# Patient Record
Sex: Male | Born: 1980 | Race: White | Hispanic: Yes | Marital: Single | Smoking: Former smoker
Health system: Southern US, Community
[De-identification: ages and names within clinical notes are randomized; demographics above are authoritative.]

## PROBLEM LIST (undated history)

## (undated) DIAGNOSIS — S060XAA Concussion with loss of consciousness status unknown, initial encounter: Secondary | ICD-10-CM

## (undated) DIAGNOSIS — S060X9A Concussion with loss of consciousness of unspecified duration, initial encounter: Secondary | ICD-10-CM

---

## 2013-03-22 ENCOUNTER — Emergency Department (HOSPITAL_BASED_OUTPATIENT_CLINIC_OR_DEPARTMENT_OTHER)
Admission: EM | Admit: 2013-03-22 | Discharge: 2013-03-22 | Disposition: A | Discharge status: Home / Self Care | Payer: Self-pay | Attending: Emergency Medicine | Admitting: Emergency Medicine

## 2013-03-22 ENCOUNTER — Encounter (HOSPITAL_BASED_OUTPATIENT_CLINIC_OR_DEPARTMENT_OTHER): Payer: Self-pay | Admitting: *Deleted

## 2013-03-22 DIAGNOSIS — Z87828 Personal history of other (healed) physical injury and trauma: Secondary | ICD-10-CM | POA: Insufficient documentation

## 2013-03-22 DIAGNOSIS — K029 Dental caries, unspecified: Secondary | ICD-10-CM | POA: Insufficient documentation

## 2013-03-22 DIAGNOSIS — F17200 Nicotine dependence, unspecified, uncomplicated: Secondary | ICD-10-CM | POA: Insufficient documentation

## 2013-03-22 HISTORY — DX: Concussion with loss of consciousness of unspecified duration, initial encounter: S06.0X9A

## 2013-03-22 HISTORY — DX: Concussion with loss of consciousness status unknown, initial encounter: S06.0XAA

## 2013-03-22 MED ORDER — HYDROCODONE-ACETAMINOPHEN 5-325 MG PO TABS: 1.0000 | ORAL_TABLET | Freq: Four times a day (QID) | ORAL | Status: DC | PRN

## 2013-03-22 MED ORDER — PENICILLIN V POTASSIUM 500 MG PO TABS: 500.0000 mg | ORAL_TABLET | Freq: Four times a day (QID) | ORAL | Status: AC

## 2013-03-22 NOTE — ED Notes (Signed)
Pt sts that he is allergic to Lortab but he can take Vicodin.

## 2013-03-22 NOTE — ED Notes (Signed)
Pt c/o pain in his upper left molar x3 days.

## 2013-03-22 NOTE — ED Provider Notes (Signed)
   History    CSN: [id number] Arrival date & time 03/22/13  0541  First MD Initiated Contact with Patient 03/22/13 0543     Chief Complaint  Patient presents with  . Dental Pain   (Consider location/radiation/quality/duration/timing/severity/associated sxs/prior Treatment) Patient is a 32 y.o. male presenting with tooth pain. The history is provided by the patient. No language interpreter was used.  Dental Pain Location:  Upper Upper teeth location:  10/LU lateral incisor Quality:  Dull Severity:  Severe Onset quality:  Gradual Duration:  3 days Timing:  Constant Progression:  Unchanged Chronicity:  New Context: dental fracture   Context: not abscess   Previous work-up:  Dental exam Relieved by:  Nothing Worsened by:  Nothing tried Ineffective treatments:  None tried Associated symptoms: no fever and no neck swelling   Risk factors: smoking    Past Medical History  Diagnosis Date  . Concussion    History reviewed. No pertinent past surgical history. No family history on file. History  Substance Use Topics  . Smoking status: Current Every Day Smoker  . Smokeless tobacco: Not on file  . Alcohol Use: No    Review of Systems  Constitutional: Negative for fever.  All other systems reviewed and are negative.    Allergies  Flexeril; Lortab; and Ultram  Home Medications  No current outpatient prescriptions on file. BP 142/91  Pulse 86  Temp(Src) 98.1 F (36.7 C) (Oral)  Resp 18  Ht 6' (1.829 m)  Wt 175 lb (79.379 kg)  BMI 23.73 kg/m2  SpO2 100% Physical Exam  Constitutional: He is oriented to person, place, and time. He appears well-developed and well-nourished. No distress.  HENT:  Head: Normocephalic and atraumatic.  Mouth/Throat: Oropharynx is clear and moist.    Eyes: Conjunctivae are normal. Pupils are equal, round, and reactive to light.  Neck: Normal range of motion. Neck supple.  Cardiovascular: Normal rate and regular rhythm.     Pulmonary/Chest: Effort normal and breath sounds normal. He has no wheezes. He has no rales.  Abdominal: Soft. Bowel sounds are normal.  Musculoskeletal: Normal range of motion.  Lymphadenopathy:    He has no cervical adenopathy.  Neurological: He is alert and oriented to person, place, and time.  Skin: Skin is warm and dry.  Psychiatric: He has a normal mood and affect.    ED Course  Procedures (including critical care time) Labs Reviewed - No data to display No results found. 1. Dental caries     MDM  Patient has an appointment per his report on Monday with a dentist and states with nurse present that he can take vicodin without issue but not lortab  [name] K Deeya Richeson-Rasch, MD 03/22/13 [phone]

## 2013-08-13 ENCOUNTER — Encounter (HOSPITAL_BASED_OUTPATIENT_CLINIC_OR_DEPARTMENT_OTHER): Payer: Self-pay | Admitting: Emergency Medicine

## 2013-08-13 ENCOUNTER — Emergency Department (HOSPITAL_BASED_OUTPATIENT_CLINIC_OR_DEPARTMENT_OTHER)
Admission: EM | Admit: 2013-08-13 | Discharge: 2013-08-13 | Disposition: A | Discharge status: Home / Self Care | Payer: Self-pay | Attending: Emergency Medicine | Admitting: Emergency Medicine

## 2013-08-13 DIAGNOSIS — Z87891 Personal history of nicotine dependence: Secondary | ICD-10-CM | POA: Insufficient documentation

## 2013-08-13 DIAGNOSIS — R51 Headache: Secondary | ICD-10-CM | POA: Insufficient documentation

## 2013-08-13 DIAGNOSIS — Z87828 Personal history of other (healed) physical injury and trauma: Secondary | ICD-10-CM | POA: Insufficient documentation

## 2013-08-13 DIAGNOSIS — H53149 Visual discomfort, unspecified: Secondary | ICD-10-CM | POA: Insufficient documentation

## 2013-08-13 DIAGNOSIS — R112 Nausea with vomiting, unspecified: Secondary | ICD-10-CM | POA: Insufficient documentation

## 2013-08-13 DIAGNOSIS — Z8659 Personal history of other mental and behavioral disorders: Secondary | ICD-10-CM | POA: Insufficient documentation

## 2013-08-13 MED ORDER — METOCLOPRAMIDE HCL 5 MG/ML IJ SOLN
10.0000 mg | Freq: Once | INTRAMUSCULAR | Status: AC
  Administered 2013-08-13: 10 mg via INTRAVENOUS
  Filled 2013-08-13: qty 2

## 2013-08-13 MED ORDER — PROMETHAZINE HCL 25 MG PO TABS: 25.0000 mg | ORAL_TABLET | Freq: Four times a day (QID) | ORAL | Status: DC | PRN

## 2013-08-13 MED ORDER — SODIUM CHLORIDE 0.9 % IV SOLN
Freq: Once | INTRAVENOUS | Status: AC
  Administered 2013-08-13: 18:00:00 via INTRAVENOUS

## 2013-08-13 NOTE — ED Notes (Signed)
NP at bedside.

## 2013-08-13 NOTE — ED Provider Notes (Signed)
CSN: [id number]     Arrival date & time 08/13/13  1517 History   First MD Initiated Contact with Patient 08/13/13 1656     Chief Complaint  Patient presents with  . Headache   (Consider location/radiation/quality/duration/timing/severity/associated sxs/prior Treatment) Patient is a 32 y.o. male presenting with headaches. The history is provided by the patient.  Headache Pain location:  Generalized Quality:  Sharp Severity currently:  10/10 Severity at highest:  9/10 Onset quality:  Sudden Duration:  1 day Timing:  Constant Progression:  Unchanged Chronicity:  Recurrent Similar to prior headaches: yes   Relieved by:  Nothing Worsened by:  Light, sound and neck movement Associated symptoms: nausea, photophobia and vomiting   Associated symptoms: no abdominal pain, no back pain, no congestion, no cough, no dizziness, no ear pain, no pain, no facial pain, no fever, no sinus pressure, no sore throat, no swollen glands, no URI and no weakness    Dillon Barker is a 32 y.o. male who presents to the ED with a headache that was there when he woke yesterday morning. The headache is located in the frontal and top of the head and radiates to the back of the head. 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 He denies fever or chills. He went to [facility] ED last night and they started and IV, gave him IV medications and drew blood. CBC was normal. He felt a little better, discharged home with Imitrex and Lodine. He states he did not get the medications because he didn't know what they were.   Past Medical History  Diagnosis Date  . Concussion    History reviewed. No pertinent past surgical history. No family history on file. History  Substance Use Topics  . Smoking status: Former Games developer  . Smokeless tobacco: Not on file  . Alcohol Use: No    Review of Systems  Constitutional: Negative for fever and chills.  HENT: Negative for congestion, ear pain, sinus pressure  and sore throat.   Eyes: Positive for photophobia. Negative for pain, redness and itching.  Respiratory: Negative for cough, chest tightness and shortness of breath.   Cardiovascular: Negative for chest pain.  Gastrointestinal: Positive for nausea and vomiting. Negative for abdominal pain.  Genitourinary: Negative for dysuria and frequency.  Musculoskeletal: Negative for back pain.  Skin: Negative for rash.  Allergic/Immunologic: Negative for immunocompromised state.  Neurological: Positive for headaches. Negative for dizziness, syncope and light-headedness.  Psychiatric/Behavioral: The patient is not nervous/anxious.        Hx of depression     Allergies  Flexeril; Lortab; and Ultram  Home Medications   Current Outpatient Rx  Name  Route  Sig  Dispense  Refill  . HYDROcodone-acetaminophen (NORCO/VICODIN) 5-325 MG per tablet   Oral   Take 1 tablet by mouth every 6 (six) hours as needed for pain.   11 tablet   0    BP 142/71  Pulse 68  Temp(Src) 98.4 F (36.9 C) (Oral)  Resp 18  Ht 6' (1.829 m)  Wt 190 lb (86.183 kg)  BMI 25.76 kg/m2  SpO2 100% Physical Exam  Nursing note and vitals reviewed. Constitutional: He is oriented to person, place, and time. He appears well-developed and well-nourished. No distress.  HENT:  Head: Normocephalic and atraumatic.  Right Ear: Tympanic membrane normal.  Left Ear: Tympanic membrane normal.  Nose: Nose normal.  Mouth/Throat: Uvula is midline, oropharynx is clear and moist and mucous membranes are normal.  Eyes: Conjunctivae and EOM are normal. Pupils are equal, round, and reactive to light.  Neck: Neck supple.  Cardiovascular: Normal rate and regular rhythm.   Pulmonary/Chest: Effort normal and breath sounds normal.  Abdominal: Soft. Bowel sounds are normal. There is no tenderness.  Musculoskeletal: Normal range of motion.  No tenderness over spinal cord.   Neurological: He is alert and oriented to person, place, and time. He has  normal strength and normal reflexes. No cranial nerve deficit or sensory deficit. He displays a negative Romberg sign. Coordination and gait normal.  Skin: Skin is warm and dry.  Psychiatric: He has a normal mood and affect. His behavior is normal.    ED Course: Dr. Ethelda Chick in to examine the patient.  Procedures  MDM  32 y.o. male with headache since yesterday and nausea. He has Rx for Lodine and Imitrex from ER visit to [facility] last night. Encouraged patient to fill Rx. Reglan given here in ED for nausea. Patient stable for discharge without any further screening indicated at this time. He has a normal neuro exam, vital signs normal with O2 Sat 100% on R/A.  Discussed with the patient and all questioned fully answered. He voices understanding.    Medication List    TAKE these medications       promethazine 25 MG tablet  Commonly known as:  PHENERGAN  Take 1 tablet (25 mg total) by mouth every 6 (six) hours as needed for nausea or vomiting.      ASK your doctor about these medications       HYDROcodone-acetaminophen 5-325 MG per tablet  Commonly known as:  NORCO/VICODIN  Take 1 tablet by mouth every 6 (six) hours as needed for pain.          [facility] Orlene Och, NP 08/13/13 [phone]

## 2013-08-13 NOTE — ED Provider Notes (Signed)
Presents with headache onset yesterday accompanied by photophobia similar to "concussion he had 6 months ago. No other associated symptoms no head trauma no fever no nausea or vomiting. Patient is alert Glasgow Coma Score 15 HEENT exam normocephalic atraumatic poor dentition eyes extraocular intact pupils 2-3 mm reactive to light fundi benign neurologic Glasgow Coma Score 15 cranial nerves II through XII grossly intact gait normal Romberg normal pronator drift normal  Doug Sou, MD 08/13/13 1753

## 2013-08-13 NOTE — ED Notes (Signed)
C/o HA since yesterday-reports feels like when he has had a concussion-denies recent injury-was seen at [facility] ED last night for same-"gave me some kind of medicine for migraines. i didn't get it"

## 2013-08-13 NOTE — ED Provider Notes (Signed)
Medical screening examination/treatment/procedure(s) were conducted as a shared visit with non-physician practitioner(s) and myself.  I personally evaluated the patient during the encounter.  EKG Interpretation   None        Doug Sou, MD 08/13/13 2321

## 2014-02-14 ENCOUNTER — Emergency Department (HOSPITAL_BASED_OUTPATIENT_CLINIC_OR_DEPARTMENT_OTHER)
Admission: EM | Admit: 2014-02-14 | Discharge: 2014-02-14 | Disposition: A | Discharge status: Home / Self Care | Payer: Self-pay | Attending: Emergency Medicine | Admitting: Emergency Medicine

## 2014-02-14 ENCOUNTER — Encounter (HOSPITAL_BASED_OUTPATIENT_CLINIC_OR_DEPARTMENT_OTHER): Payer: Self-pay | Admitting: Emergency Medicine

## 2014-02-14 DIAGNOSIS — Y9389 Activity, other specified: Secondary | ICD-10-CM | POA: Insufficient documentation

## 2014-02-14 DIAGNOSIS — R51 Headache: Secondary | ICD-10-CM | POA: Insufficient documentation

## 2014-02-14 DIAGNOSIS — Z87891 Personal history of nicotine dependence: Secondary | ICD-10-CM | POA: Insufficient documentation

## 2014-02-14 DIAGNOSIS — X30XXXA Exposure to excessive natural heat, initial encounter: Secondary | ICD-10-CM | POA: Insufficient documentation

## 2014-02-14 DIAGNOSIS — T675XXA Heat exhaustion, unspecified, initial encounter: Secondary | ICD-10-CM | POA: Insufficient documentation

## 2014-02-14 DIAGNOSIS — Z87828 Personal history of other (healed) physical injury and trauma: Secondary | ICD-10-CM | POA: Insufficient documentation

## 2014-02-14 DIAGNOSIS — Y990 Civilian activity done for income or pay: Secondary | ICD-10-CM | POA: Insufficient documentation

## 2014-02-14 DIAGNOSIS — Y9289 Other specified places as the place of occurrence of the external cause: Secondary | ICD-10-CM | POA: Insufficient documentation

## 2014-02-14 LAB — CBC WITH DIFFERENTIAL/PLATELET
Basophils Absolute: 0 10*3/uL (ref 0.0–0.1)
Basophils Relative: 0 % (ref 0–1)
EOS PCT: 0 % (ref 0–5)
Eosinophils Absolute: 0 10*3/uL (ref 0.0–0.7)
HCT: 42.1 % (ref 39.0–52.0)
HEMOGLOBIN: 14.2 g/dL (ref 13.0–17.0)
LYMPHS ABS: 1.4 10*3/uL (ref 0.7–4.0)
LYMPHS PCT: 11 % — AB (ref 12–46)
MCH: 29.7 pg (ref 26.0–34.0)
MCHC: 33.7 g/dL (ref 30.0–36.0)
MCV: 88.1 fL (ref 78.0–100.0)
MONOS PCT: 8 % (ref 3–12)
Monocytes Absolute: 1 10*3/uL (ref 0.1–1.0)
NEUTROS PCT: 81 % — AB (ref 43–77)
Neutro Abs: 10.3 10*3/uL — ABNORMAL HIGH (ref 1.7–7.7)
PLATELETS: 236 10*3/uL (ref 150–400)
RBC: 4.78 MIL/uL (ref 4.22–5.81)
RDW: 13.7 % (ref 11.5–15.5)
WBC: 12.6 10*3/uL — AB (ref 4.0–10.5)

## 2014-02-14 LAB — BASIC METABOLIC PANEL
BUN: 5 mg/dL — AB (ref 6–23)
CO2: 26 meq/L (ref 19–32)
Calcium: 9.8 mg/dL (ref 8.4–10.5)
Chloride: 107 mEq/L (ref 96–112)
Creatinine, Ser: 0.6 mg/dL (ref 0.50–1.35)
GFR calc Af Amer: >90 mL/min (ref >90)
GFR calc non Af Amer: >90 mL/min (ref >90)
GLUCOSE: 138 mg/dL — AB (ref 70–99)
POTASSIUM: 4.1 meq/L (ref 3.7–5.3)
SODIUM: 145 meq/L (ref 137–147)

## 2014-02-14 LAB — CK: Total CK: 66 U/L (ref 7–232)

## 2014-02-14 MED ORDER — DEXAMETHASONE SODIUM PHOSPHATE 10 MG/ML IJ SOLN
10.0000 mg | Freq: Once | INTRAMUSCULAR | Status: AC
  Administered 2014-02-14: 10 mg via INTRAVENOUS
  Filled 2014-02-14: qty 1

## 2014-02-14 MED ORDER — SODIUM CHLORIDE 0.9 % IV BOLUS (SEPSIS)
1000.0000 mL | Freq: Once | INTRAVENOUS | Status: AC
  Administered 2014-02-14: 1000 mL via INTRAVENOUS

## 2014-02-14 MED ORDER — IBUPROFEN 800 MG PO TABS
800.0000 mg | ORAL_TABLET | Freq: Once | ORAL | Status: AC
  Administered 2014-02-14: 800 mg via ORAL
  Filled 2014-02-14: qty 1

## 2014-02-14 MED ORDER — METOCLOPRAMIDE HCL 5 MG/ML IJ SOLN
10.0000 mg | Freq: Once | INTRAMUSCULAR | Status: AC
  Administered 2014-02-14: 10 mg via INTRAVENOUS
  Filled 2014-02-14: qty 2

## 2014-02-14 MED ORDER — DIPHENHYDRAMINE HCL 50 MG/ML IJ SOLN
25.0000 mg | Freq: Once | INTRAMUSCULAR | Status: AC
  Administered 2014-02-14: 25 mg via INTRAVENOUS
  Filled 2014-02-14: qty 1

## 2014-02-14 NOTE — ED Provider Notes (Signed)
CSN: [id number]     Arrival date & time 02/14/14  1320 History   First MD Initiated Contact with Patient 02/14/14 1329     Chief Complaint  Patient presents with  . 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    Patient is a 33 y.o. male presenting with weakness. The history is provided by the patient. No language interpreter was used.  Weakness This is a new problem. The current episode started 2 days ago. The problem occurs constantly. The problem has not changed since onset.Associated symptoms include headaches. Pertinent negatives include no chest pain, no abdominal pain and no shortness of breath. Nothing aggravates the symptoms. The symptoms are relieved by rest. Treatments tried: fluids. The treatment provided no relief.    Past Medical History  Diagnosis Date  . Concussion    History reviewed. No pertinent past surgical history. History reviewed. No pertinent family history. History  Substance Use Topics  . Smoking status: Former Games developermoker  . Smokeless tobacco: Not on file  . Alcohol Use: No    Review of Systems  Constitutional: Negative for fever, activity change, appetite change and fatigue.  HENT: Negative for congestion, facial swelling, rhinorrhea and trouble swallowing.   Eyes: Negative for photophobia and pain.  Respiratory: Negative for cough, chest tightness and shortness of breath.   Cardiovascular: Negative for chest pain and leg swelling.  Gastrointestinal: Negative for nausea, vomiting, abdominal pain, diarrhea and constipation.  Endocrine: Negative for polydipsia and polyuria.  Genitourinary: Negative for dysuria, urgency, decreased urine volume and difficulty urinating.  Musculoskeletal: Negative for back pain and gait problem.   Skin: Negative for color change, rash and wound.  Allergic/Immunologic: Negative for immunocompromised state.  Neurological: Positive for weakness and headaches. Negative for dizziness, facial asymmetry, speech difficulty and numbness.  Psychiatric/Behavioral: Negative for confusion, decreased concentration and agitation.      Allergies  Flexeril; Lortab; and Ultram  Home Medications   Prior to Admission medications   Medication Sig Start Date End Date Taking? Authorizing Provider  HYDROcodone-acetaminophen (NORCO/VICODIN) 5-325 MG per tablet Take 1 tablet by mouth every 6 (six) hours as needed for pain. 03/22/13   April Smitty CordsK Palumbo-Rasch, MD  promethazine (PHENERGAN) 25 MG tablet Take 1 tablet (25 mg total) by mouth every 6 (six) hours as needed for nausea or vomiting. 08/13/13   Hope Orlene OchM Neese, NP   BP 131/75  Pulse 75  Temp(Src) 98.3 F (36.8 C) (Oral)  Resp 16  Wt 190 lb (86.183 kg)  SpO2 97% Physical Exam  Constitutional: He is oriented to person, place, and time. He appears well-developed and well-nourished. No distress.  HENT:  Head: Normocephalic and atraumatic.  Mouth/Throat: No oropharyngeal exudate.  Eyes: Pupils are equal, round, and reactive to light.  Neck: Normal range of motion. Neck supple.  Cardiovascular: Normal rate, regular rhythm and normal heart sounds.  Exam reveals no gallop and no friction rub.   No murmur heard. Pulmonary/Chest: Effort normal and breath sounds normal. No respiratory distress. He has no wheezes. He has no rales.  Abdominal: Soft. Bowel sounds are normal. He exhibits no distension and no mass. There is no tenderness. There is no rebound and no guarding.  Musculoskeletal: Normal range of motion. He exhibits no edema and no tenderness.  Neurological: He is alert and oriented to  person, place, and time. He has normal strength. He displays no tremor. No cranial nerve deficit or sensory deficit. He exhibits normal muscle tone. He displays a  negative Romberg sign. Coordination abnormal. GCS eye subscore is 4. GCS verbal subscore is 5. GCS motor subscore is 6.  Skin: Skin is warm and dry.  Psychiatric: He has a normal mood and affect.    ED Course  Procedures (including critical care time) Labs Review Labs Reviewed  CBC WITH DIFFERENTIAL - Abnormal; Notable for the following:    WBC 12.6 (*)    Neutrophils Relative % 81 (*)    Neutro Abs 10.3 (*)    Lymphocytes Relative 11 (*)    All other components within normal limits  BASIC METABOLIC PANEL - Abnormal; Notable for the following:    Glucose, Bld 138 (*)    BUN 5 (*)    All other components within normal limits  CK    Imaging Review No results found.   EKG Interpretation None      MDM   Final diagnoses:  Heat exhaustion    Pt is a 33 y.o. male with Pmhx as above who presents with continued headache & malaise since possible heat related injury 2 days ago. On PE, VSS, pt in NAD. Cardiopulmonary & neuro exam benign. No head injury during episode. WBC w/ mild leukocytosis. CK nml, Cr nml. H/a improved after IVF, motrin and migraine cocktail. Will d/c home. Return precautions given for new or worsening symptoms including worsening pain, fever, focal neuro symptoms.          Shanna CiscoMegan E Docherty, MD 02/14/14 [phone]

## 2014-02-14 NOTE — ED Notes (Signed)
Pt amb to room 7 with quick steady gait in nad. Pt reports working outside all week, on Tuesday he "passed out and throwed up umpteen thousand times" [facility], where he was given iv fluids and pain meds. Pt states he has felt fatigued since then, states he has not been able to return to work due to feeling weak and fatigued.

## 2014-02-14 NOTE — Discharge Instructions (Signed)
Heat-Related Illness °Heat-related illnesses occur when the body is unable to properly cool itself. The body normally cools itself by sweating. However, under some conditions sweating is not enough. In these cases, a person's body temperature rises rapidly. Very high body temperatures may damage the brain or other vital organs. Some examples of heat-related illnesses include: °· Heat stroke. This occurs when the body is unable to regulate its temperature. The body's temperature rises rapidly, the sweating mechanism fails, and the body is unable to cool down. Body temperature may rise to 106° F (41° C) or higher within 10 to 15 minutes. Heat stroke can cause death or permanent disability if emergency treatment is not provided. °· Heat exhaustion. This is a milder form of heat-related illness that can develop after several days of exposure to high temperatures and not enough fluids. It is the body's response to an excessive loss of the water and salt contained in sweat. °· Heat cramps. These usually affect people who sweat a lot during heavy activity. This sweating drains the body's salt and moisture. The low salt level in the muscles causes painful cramps. Heat cramps may also be a symptom of heat exhaustion. Heat cramps usually occur in the abdomen, arms, or legs. Get medical attention for cramps if you have heart problems or are on a low-sodium diet. °Those that are at greatest risk for heat-related illnesses include:  °· The elderly. °· Infant and the very young. °· People with mental illness and chronic diseases. °· People who are overweight (obese). °· Young and healthy people can even succumb to heat if they participate in strenuous physical activities during hot weather. °CAUSES  °Several factors affect the body's ability to cool itself during extremely hot weather. When the humidity is high, sweat will not evaporate as quickly. This prevents the body from releasing heat quickly. Other factors that can affect  the body's ability to cool down include:  °· Age. °· Obesity. °· Fever. °· Dehydration. °· Heart disease. °· Mental illness. °· Poor circulation. °· Sunburn. °· Prescription drug use. °· Alcohol use. °SYMPTOMS  °Heat stroke: Warning signs of heat stroke vary, but may include: °· An extremely high body temperature (above 103°F orally). °· A fast, strong pulse. °· Dizziness. °· Confusion. °· Red, hot, and dry skin. °· No sweating. °· Throbbing headache. °· Feeling sick to your stomach (nauseous). °· Unconsciousness. °Heat exhaustion: Warning signs of heat exhaustion include: °· Heavy sweating. °· Tiredness. °· Headache. °· Paleness. °· Weakness. °· Feeling sick to your stomach (nauseous) or vomiting. °· Muscle cramps. °Heat cramps °· Muscle pains or spasms. °TREATMENT  °Heat stroke °· Get into a cool environment. An indoor place that is air-conditioned may be best. °· Take a cool shower or bath. Have someone around to make sure you are okay. °· Take your temperature. Make sure it is going down. °Heat exhaustion °· Drink plenty of fluids. Do not drink liquids that contain caffeine, alcohol, or large amounts of sugar. These cause you to lose more body fluid. Also, avoid very cold drinks. They can cause stomach cramps. °· Get into a cool environment. An indoor place that is air-conditioned may be best. °· Take a cool shower or bath. Have someone around to make sure you are okay. °· Put on lightweight clothing. °Heat cramps °· Stop whatever activity you were doing. Do not attempt to do that activity for at least 3 hours after the cramps have gone away. °· Get into a cool environment. An indoor   place that is air-conditioned may be best. °HOME CARE INSTRUCTIONS  °To protect your health when temperatures are extremely high, follow these tips: °· During heavy exercise in a hot environment, drink two to four glasses (16-32 ounces) of cool fluids each hour. Do not wait until you are thirsty to drink. Warning: If your caregiver  limits the amount of fluid you drink or has you on water pills, ask how much you should drink while the weather is hot. °· Do not drink liquids that contain caffeine, alcohol, or large amounts of sugar. These cause you to lose more body fluid. °· Avoid very cold drinks. They can cause stomach cramps. °· Wear appropriate clothing. Choose lightweight, light-colored, loose-fitting clothing. °· If you must be outdoors, try to limit your outdoor activity to morning and evening hours. Try to rest often in shady areas. °· If you are not used to working or exercising in a hot environment, start slowly and pick up the pace gradually. °· Stay cool in an air-conditioned place if possible. If your home does not have air conditioning, go to the shopping mall or public library. °· Taking a cool shower or bath may help you cool off. °SEEK MEDICAL CARE IF:  °· You see any of the symptoms listed above. You may be dealing with a life-threatening emergency. °· Symptoms worsen or last longer than 1 hour. °· Heat cramps do not get better in 1 hour. °MAKE SURE YOU:  °· Understand these instructions. °· Will watch your condition. °· Will get help right away if you are not doing well or get worse. °Document Released: 05/25/2008 Document Revised: 11/08/2011 Document Reviewed: 05/25/2008 °[employer]® Patient Information ©2015 [employer]. This information is not intended to replace advice given to you by your health care provider. Make sure you discuss any questions you have with your health care provider. ° °

## 2015-03-15 ENCOUNTER — Emergency Department (HOSPITAL_BASED_OUTPATIENT_CLINIC_OR_DEPARTMENT_OTHER): Payer: Self-pay

## 2015-03-15 ENCOUNTER — Emergency Department (HOSPITAL_BASED_OUTPATIENT_CLINIC_OR_DEPARTMENT_OTHER)
Admission: EM | Admit: 2015-03-15 | Discharge: 2015-03-15 | Disposition: A | Discharge status: Home / Self Care | Payer: Self-pay | Attending: Emergency Medicine | Admitting: Emergency Medicine

## 2015-03-15 DIAGNOSIS — Z87828 Personal history of other (healed) physical injury and trauma: Secondary | ICD-10-CM | POA: Insufficient documentation

## 2015-03-15 DIAGNOSIS — K5900 Constipation, unspecified: Secondary | ICD-10-CM | POA: Insufficient documentation

## 2015-03-15 DIAGNOSIS — R3915 Urgency of urination: Secondary | ICD-10-CM | POA: Insufficient documentation

## 2015-03-15 DIAGNOSIS — Z87891 Personal history of nicotine dependence: Secondary | ICD-10-CM | POA: Insufficient documentation

## 2015-03-15 DIAGNOSIS — R109 Unspecified abdominal pain: Secondary | ICD-10-CM

## 2015-03-15 LAB — CBC WITH DIFFERENTIAL/PLATELET
Basophils Absolute: 0 10*3/uL (ref 0.0–0.1)
Basophils Relative: 0 % (ref 0–1)
EOS ABS: 0.3 10*3/uL (ref 0.0–0.7)
EOS PCT: 3 % (ref 0–5)
HCT: 40.2 % (ref 39.0–52.0)
HEMOGLOBIN: 13.7 g/dL (ref 13.0–17.0)
LYMPHS ABS: 1.6 10*3/uL (ref 0.7–4.0)
Lymphocytes Relative: 18 % (ref 12–46)
MCH: 29.4 pg (ref 26.0–34.0)
MCHC: 34.1 g/dL (ref 30.0–36.0)
MCV: 86.3 fL (ref 78.0–100.0)
MONOS PCT: 11 % (ref 3–12)
Monocytes Absolute: 1 10*3/uL (ref 0.1–1.0)
Neutro Abs: 6.1 10*3/uL (ref 1.7–7.7)
Neutrophils Relative %: 68 % (ref 43–77)
Platelets: 198 10*3/uL (ref 150–400)
RBC: 4.66 MIL/uL (ref 4.22–5.81)
RDW: 13.1 % (ref 11.5–15.5)
WBC: 9 10*3/uL (ref 4.0–10.5)

## 2015-03-15 LAB — URINALYSIS, ROUTINE W REFLEX MICROSCOPIC
BILIRUBIN URINE: NEGATIVE
GLUCOSE, UA: NEGATIVE mg/dL
HGB URINE DIPSTICK: NEGATIVE
Ketones, ur: NEGATIVE mg/dL
Leukocytes, UA: NEGATIVE
NITRITE: NEGATIVE
PH: 6 (ref 5.0–8.0)
Protein, ur: NEGATIVE mg/dL
SPECIFIC GRAVITY, URINE: 1.01 (ref 1.005–1.030)
Urobilinogen, UA: 0.2 mg/dL (ref 0.0–1.0)

## 2015-03-15 LAB — BASIC METABOLIC PANEL
Anion gap: 10 (ref 5–15)
BUN: 9 mg/dL (ref 6–20)
CHLORIDE: 104 mmol/L (ref 101–111)
CO2: 25 mmol/L (ref 22–32)
Calcium: 9 mg/dL (ref 8.9–10.3)
Creatinine, Ser: 0.75 mg/dL (ref 0.61–1.24)
GFR calc Af Amer: >60 mL/min (ref >60)
Glucose, Bld: 92 mg/dL (ref 65–99)
POTASSIUM: 3.6 mmol/L (ref 3.5–5.1)
SODIUM: 139 mmol/L (ref 135–145)

## 2015-03-15 MED ORDER — SODIUM CHLORIDE 0.9 % IV BOLUS (SEPSIS)
500.0000 mL | Freq: Once | INTRAVENOUS | Status: AC
  Administered 2015-03-15: 500 mL via INTRAVENOUS

## 2015-03-15 MED ORDER — ONDANSETRON HCL 4 MG/2ML IJ SOLN
4.0000 mg | Freq: Once | INTRAMUSCULAR | Status: AC
  Administered 2015-03-15: 4 mg via INTRAVENOUS
  Filled 2015-03-15: qty 2

## 2015-03-15 MED ORDER — FENTANYL CITRATE (PF) 100 MCG/2ML IJ SOLN
50.0000 ug | Freq: Once | INTRAMUSCULAR | Status: AC
  Administered 2015-03-15: 50 ug via INTRAVENOUS
  Filled 2015-03-15: qty 2

## 2015-03-15 MED ORDER — SODIUM CHLORIDE 0.9 % IV SOLN
INTRAVENOUS | Status: DC
  Administered 2015-03-15: 14:00:00 via INTRAVENOUS

## 2015-03-15 MED ORDER — NAPROXEN 500 MG PO TABS: 500.0000 mg | ORAL_TABLET | Freq: Two times a day (BID) | ORAL | Status: AC

## 2015-03-15 MED ORDER — DOCUSATE SODIUM 100 MG PO CAPS: 100.0000 mg | ORAL_CAPSULE | Freq: Two times a day (BID) | ORAL | Status: AC

## 2015-03-15 NOTE — Discharge Instructions (Signed)
No evidence of any kidney stone. No evidence of any urinary tract infection. There is evidence of constipation. Take Colace as directed. Increase your fluid intake. Referral information provided to urology if the trouble with voiding does not overdistention of the bladder here today.

## 2015-03-15 NOTE — ED Notes (Signed)
Patient here with 3-4 days of right flank pain and burning with urination. Reports that he is passing minimal urine and feels a lot of pressure in back and abdomen. No hx of stones

## 2015-03-15 NOTE — ED Provider Notes (Signed)
CSN: [id number]     Arrival date & time 03/15/15  1233 History   First MD Initiated Contact with Patient 03/15/15 1303     No chief complaint on file.    (Consider location/radiation/quality/duration/timing/severity/associated sxs/prior Treatment) The history is provided by the patient.   34 year old male 3-4 day history of left flank pain and some left lower back pain. Also some suprapubic discomfort difficulty voiding. States the only bowing a small amount and always feels like he needs to go. No nausea no vomiting no fevers no blood in the urine. No history of kidney stones in the past. No past history of any voiding problems.  Past Medical History  Diagnosis Date  . Concussion    No past surgical history on file. No family history on file. History  Substance Use Topics  . Smoking status: Former Games developer  . Smokeless tobacco: Not on file  . Alcohol Use: No    Review of Systems  Constitutional: Negative for fever.  HENT: Negative for congestion.   Eyes: Negative for redness.  Respiratory: Negative for shortness of breath.   Cardiovascular: Negative for chest pain.  Gastrointestinal: Positive for abdominal pain and constipation. Negative for nausea and vomiting.  Genitourinary: Positive for flank pain.  Musculoskeletal: Positive for back pain. Negative for neck pain.  Skin: Negative for rash.  Neurological: Negative for headaches.  Hematological: Does not bruise/bleed easily.  Psychiatric/Behavioral: Negative for confusion.      Allergies  Flexeril; Lortab; and Ultram  Home Medications   Prior to Admission medications   Medication Sig Start Date End Date Taking? Authorizing Provider  docusate sodium (COLACE) 100 MG capsule Take 1 capsule (100 mg total) by mouth every 12 (twelve) hours. 03/15/15   Vanetta Mulders, MD  naproxen (NAPROSYN) 500 MG tablet Take 1 tablet (500 mg total) by mouth 2 (two) times daily. 03/15/15   Vanetta Mulders, MD   BP 115/83 mmHg  Pulse 62   Temp(Src) 98.3 F (36.8 C) (Oral)  Resp 18  Ht  (1.803 m)  Wt 180 lb (81.647 kg)  BMI 25.12 kg/m2  SpO2 99% Physical Exam  Constitutional: He is oriented to person, place, and time. He appears well-developed and well-nourished. No distress.  HENT:  Head: Normocephalic and atraumatic.  Eyes: Conjunctivae and EOM are normal. Pupils are equal, round, and reactive to light.  Neck: Normal range of motion.  Cardiovascular: Normal rate, regular rhythm and normal heart sounds.   No murmur heard. Pulmonary/Chest: Effort normal and breath sounds normal.  Abdominal: Soft. Bowel sounds are normal. He exhibits no mass. There is no tenderness.  Musculoskeletal: Normal range of motion. He exhibits no edema.  Neurological: He is alert and oriented to person, place, and time. No cranial nerve deficit. He exhibits normal muscle tone. Coordination normal.  Skin: Skin is warm. No rash noted.  Nursing note and vitals reviewed.   ED Course  Procedures (including critical care time) Labs Review Labs Reviewed  URINALYSIS, ROUTINE W REFLEX MICROSCOPIC (NOT AT [facility])  CBC WITH DIFFERENTIAL/PLATELET  BASIC METABOLIC PANEL   Results for orders placed or performed during the hospital encounter of 03/15/15  Urinalysis, Routine w reflex microscopic (not at [facility])  Result Value Ref Range   Color, Urine YELLOW YELLOW   APPearance CLEAR CLEAR   Specific Gravity, Urine 1.010 1.005 - 1.030   pH 6.0 5.0 - 8.0   Glucose, UA NEGATIVE NEGATIVE mg/dL   Hgb urine dipstick NEGATIVE NEGATIVE   Bilirubin Urine NEGATIVE NEGATIVE  Ketones, ur NEGATIVE NEGATIVE mg/dL   Protein, ur NEGATIVE NEGATIVE mg/dL   Urobilinogen, UA 0.2 0.0 - 1.0 mg/dL   Nitrite NEGATIVE NEGATIVE   Leukocytes, UA NEGATIVE NEGATIVE  CBC with Differential  Result Value Ref Range   WBC 9.0 4.0 - 10.5 K/uL   RBC 4.66 4.22 - 5.81 MIL/uL   Hemoglobin 13.7 13.0 - 17.0 g/dL   HCT 16.1 09.6 - 04.5 %   MCV 86.3 78.0 - 100.0 fL   MCH 29.4 26.0  - 34.0 pg   MCHC 34.1 30.0 - 36.0 g/dL   RDW 40.9 81.1 - 91.4 %   Platelets 198 150 - 400 K/uL   Neutrophils Relative % 68 43 - 77 %   Neutro Abs 6.1 1.7 - 7.7 K/uL   Lymphocytes Relative 18 12 - 46 %   Lymphs Abs 1.6 0.7 - 4.0 K/uL   Monocytes Relative 11 3 - 12 %   Monocytes Absolute 1.0 0.1 - 1.0 K/uL   Eosinophils Relative 3 0 - 5 %   Eosinophils Absolute 0.3 0.0 - 0.7 K/uL   Basophils Relative 0 0 - 1 %   Basophils Absolute 0.0 0.0 - 0.1 K/uL  Basic metabolic panel  Result Value Ref Range   Sodium 139 135 - 145 mmol/L   Potassium 3.6 3.5 - 5.1 mmol/L   Chloride 104 101 - 111 mmol/L   CO2 25 22 - 32 mmol/L   Glucose, Bld 92 65 - 99 mg/dL   BUN 9 6 - 20 mg/dL   Creatinine, Ser 7.82 0.61 - 1.24 mg/dL   Calcium 9.0 8.9 - 95.6 mg/dL   GFR calc non Af Amer >60 >60 mL/min   GFR calc Af Amer >60 >60 mL/min   Anion gap 10 5 - 15     Imaging Review Ct Renal Stone Study  03/15/2015   CLINICAL DATA:  Unable to urinate for 3 days  EXAM: CT ABDOMEN AND PELVIS WITHOUT CONTRAST  TECHNIQUE: Multidetector CT imaging of the abdomen and pelvis was performed following the standard protocol without IV contrast.  COMPARISON:  None.  FINDINGS: Lung bases are unremarkable. Sagittal images of the spine are unremarkable.  Unenhanced liver, pancreas, spleen and adrenal glands are unremarkable. No calcified gallstones are noted within gallbladder. No aortic aneurysm. Unenhanced kidneys are symmetrical in size. No hydronephrosis or hydroureter. No nephrolithiasis. No calcified ureteral calculi.  Moderate stool noted in right colon and transverse colon. No pericecal inflammation. Normal appendix. Unremarkable terminal ileum. There is moderate stool within sigmoid colon. Abundant stool and gas noted within rectum. The rectum is distended up to 5.3 cm in diameter. Prostate gland and seminal vesicles are unremarkable. No calcified calculi are noted within urinary bladder. There is no small bowel obstruction. No  ascites or free air. No adenopathy. No inguinal adenopathy.  IMPRESSION: 1. No nephrolithiasis.  No hydronephrosis or hydroureter. 2. No calcified ureteral calculi. 3. Normal appendix.  No pericecal inflammation. 4. Moderate stool noted within sigmoid colon. Abundant stool and gas noted within rectum. The rectum measures 5.3 cm in diameter. 5. No calcified calculi are noted within urinary bladder.   Electronically Signed   By: Natasha Mead M.D.   On: 03/15/2015 14:18     EKG Interpretation None      MDM   Final diagnoses:  Flank pain  Constipation, unspecified constipation type  Urgency of urination   Patient with complaint of right flank pain for 3-4 days. No nausea no vomiting no fevers also  with difficulty urinating only urinating a small amount. Feels as if bladder is overdistended. Also not having very frequent bowel movements. No history of kidney stones in the past no injury to the back.  Bladder scan was done here and only showed about 200 250 mL of urine in the bladder prior to voiding. Patient was able to provide a urine sample and had no evidence of any abnormalities no evidence urinary tract infection or a niece getting it hematuria. CT scan showed no evidence of any kidney stone. However it did show evidence of a significant of large amount of stool in the sigmoid colon rectal area. This may be interfering with needing to void. Patient was given a liter of fluid here overall feeling better. Do not feel that patient has a the need for a urinary catheter since bladder is not overdistended. I will treat with stool softeners increase fluids and follow-up with urology if the voiding problem does not improve.     Vanetta MuldersScott Lashala Laser, MD 03/15/15 [phone]

## 2015-12-19 IMAGING — CT CT RENAL STONE PROTOCOL
2 of 4 series · 16 of 46 positions shown, 18 images · non-contrast
Comparison: None.

CLINICAL DATA: Unable to urinate for 3 days

EXAM:
CT ABDOMEN AND PELVIS WITHOUT CONTRAST
TECHNIQUE: Multidetector CT imaging of the abdomen and pelvis was performed
following the standard protocol without IV contrast.

[Series 2: renal stone < 200 lbs 5.0 b31f · axial · 0.73mm/px · z∈[-428,+17]mm · 13 of 97 slices shown, 15 images]
[im 4/97  soft-tissue]
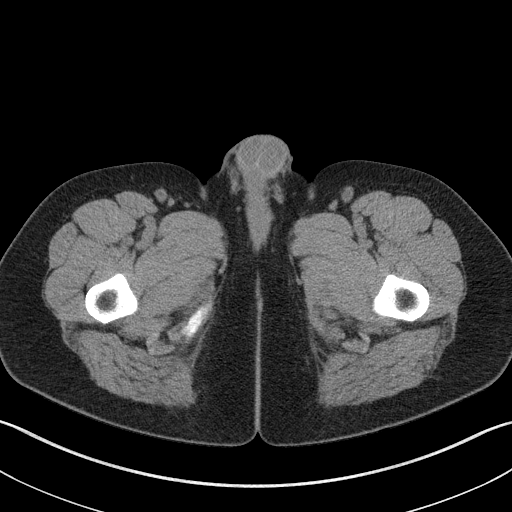
[im 4/97  bone]
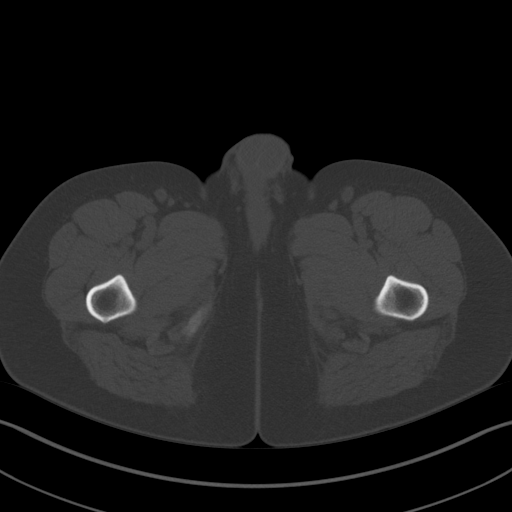
[im 12/97  soft-tissue]
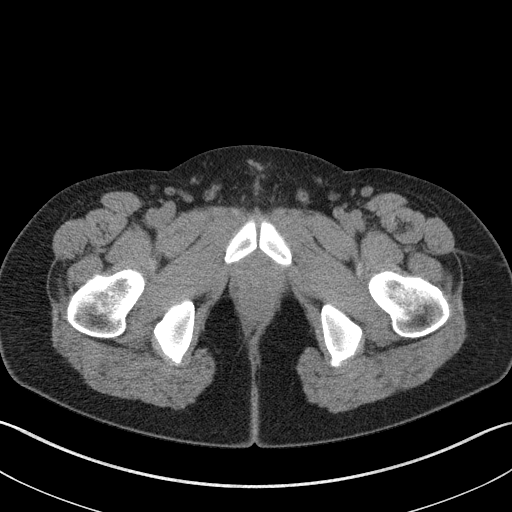
[im 19/97  soft-tissue]
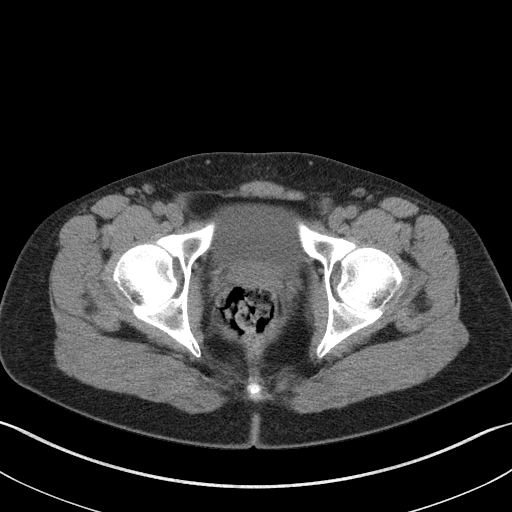
[im 26/97  soft-tissue]
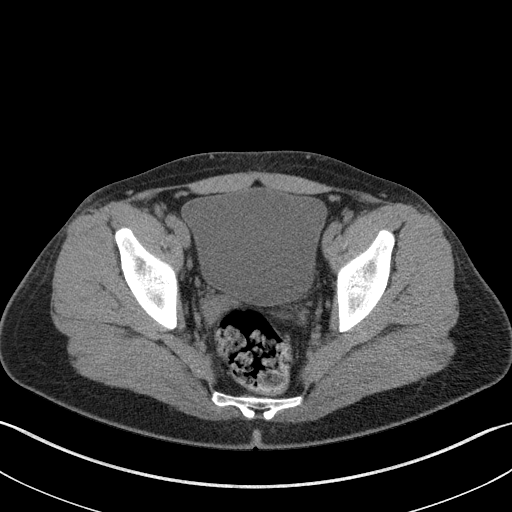
[im 34/97  soft-tissue]
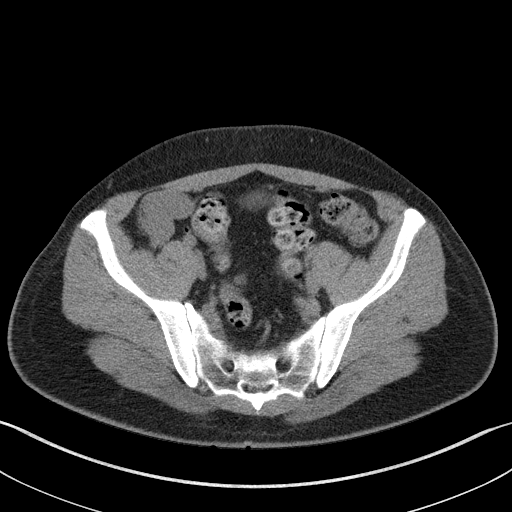
[im 41/97  soft-tissue]
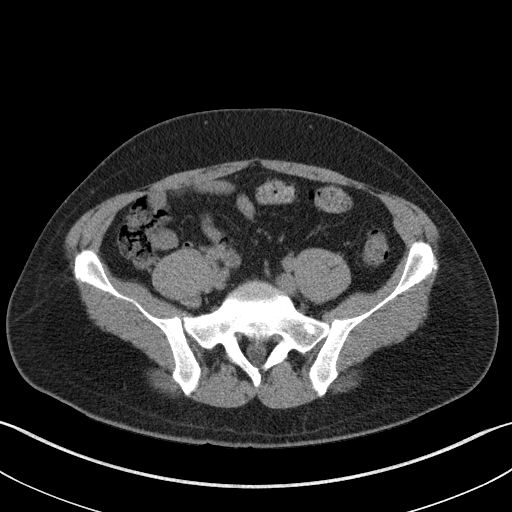
[im 49/97  soft-tissue]
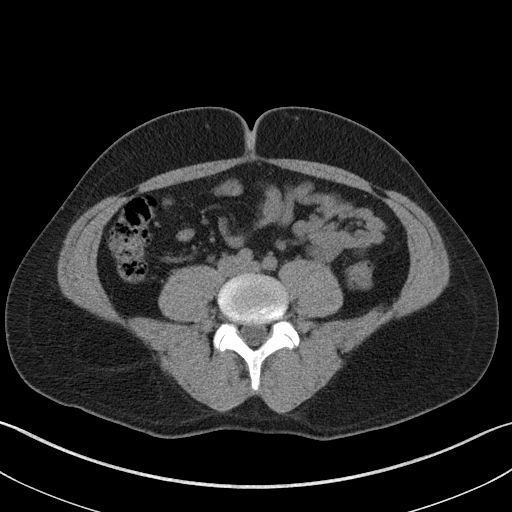
[im 56/97  soft-tissue]
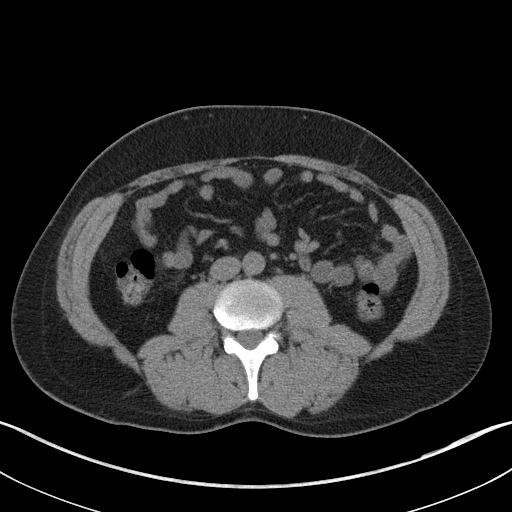
[im 63/97  soft-tissue]
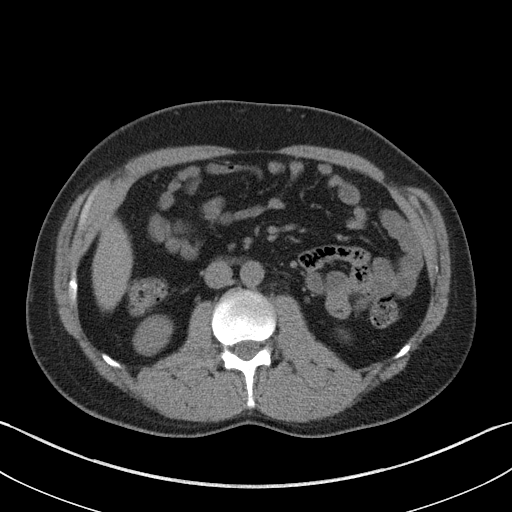
[im 63/97  bone]
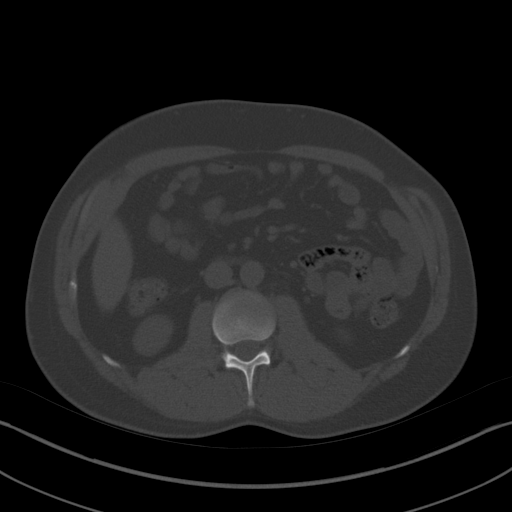
[im 71/97  soft-tissue]
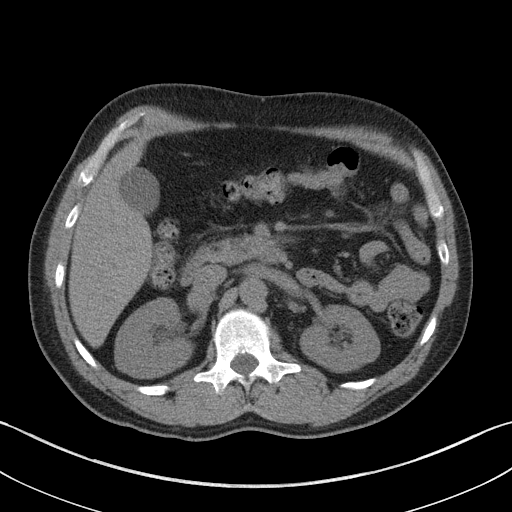
[im 78/97  soft-tissue]
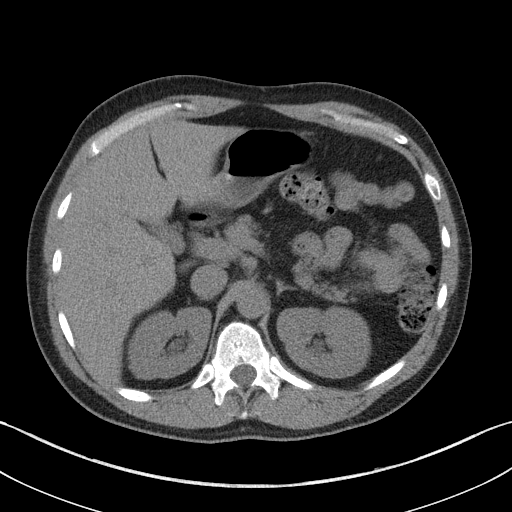
[im 85/97  soft-tissue]
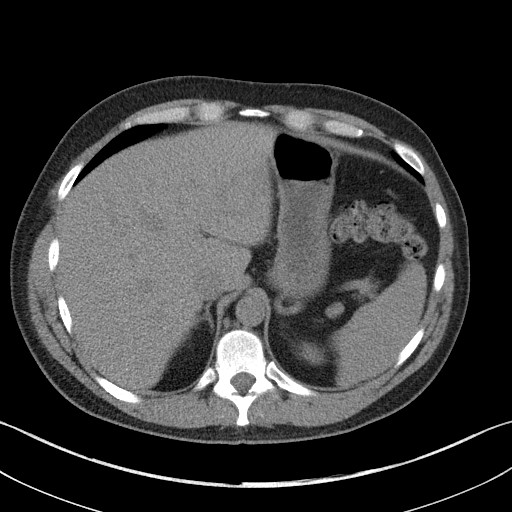
[im 93/97  soft-tissue]
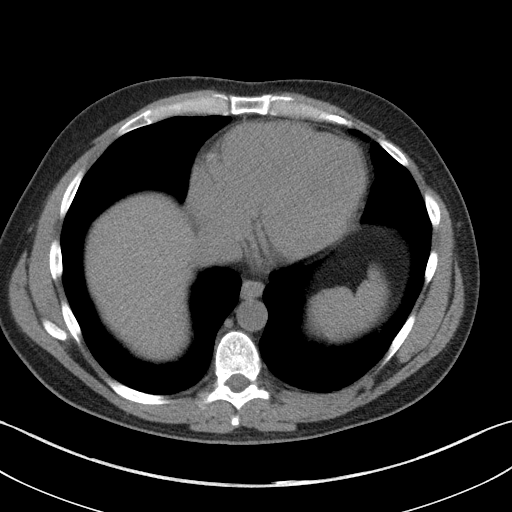

[Series 5: renal stone 3.0 coronal · coronal · 0.81mm/px · 3 of 85 slices shown]
[im 29/85  soft-tissue]
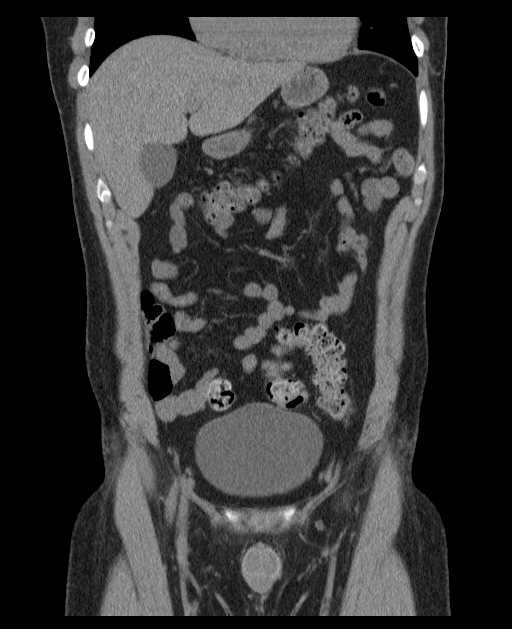
[im 38/85  soft-tissue]
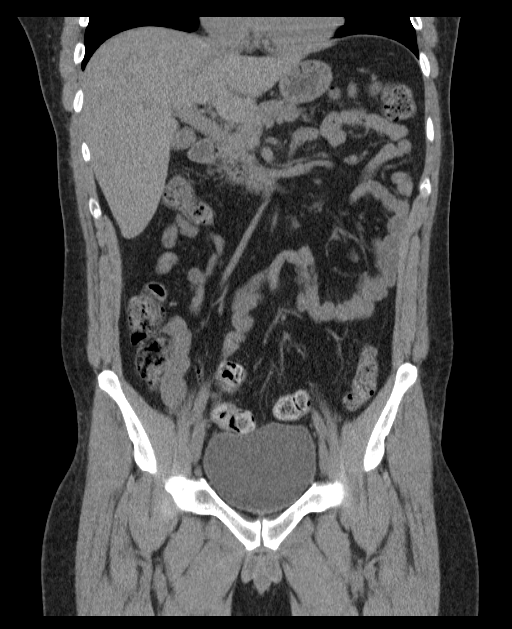
[im 47/85  soft-tissue]
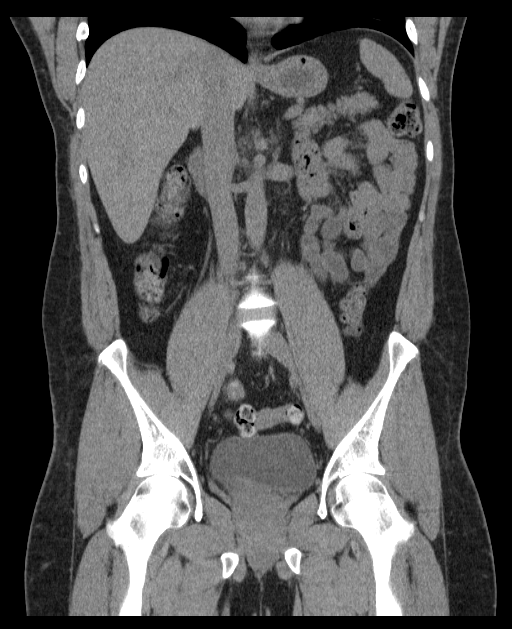

[16 of 46 positions shown; findings below may reference images not displayed]

FINDINGS: Lung bases are unremarkable. Sagittal images of the spine are
unremarkable.

Unenhanced liver, pancreas, spleen and adrenal glands are
unremarkable. No calcified gallstones are noted within gallbladder.
No aortic aneurysm. Unenhanced kidneys are symmetrical in size. No
hydronephrosis or hydroureter. No nephrolithiasis. No calcified
ureteral calculi.

Moderate stool noted in right colon and transverse colon. No
pericecal inflammation. Normal appendix. Unremarkable terminal
ileum. There is moderate stool within sigmoid colon. Abundant stool
and gas noted within rectum. The rectum is distended up to 5.3 cm in
diameter. Prostate gland and seminal vesicles are unremarkable. No
calcified calculi are noted within urinary bladder. There is no
small bowel obstruction. No ascites or free air. No adenopathy. No
inguinal adenopathy.
IMPRESSION: 1. No nephrolithiasis.  No hydronephrosis or hydroureter.
2. No calcified ureteral calculi.
3. Normal appendix.  No pericecal inflammation.
4. Moderate stool noted within sigmoid colon. Abundant stool and gas
noted within rectum. The rectum measures 5.3 cm in diameter.
5. No calcified calculi are noted within urinary bladder.
